# Patient Record
Sex: Female | Born: 1955 | Race: White | Hispanic: No | Marital: Married | Smoking: Never smoker
Health system: Southern US, Community
[De-identification: ages and names within clinical notes are randomized; demographics above are authoritative.]

## PROBLEM LIST (undated history)

## (undated) DIAGNOSIS — Z860100 Personal history of colon polyps, unspecified: Secondary | ICD-10-CM

## (undated) DIAGNOSIS — K219 Gastro-esophageal reflux disease without esophagitis: Secondary | ICD-10-CM

## (undated) DIAGNOSIS — E559 Vitamin D deficiency, unspecified: Secondary | ICD-10-CM

## (undated) DIAGNOSIS — K589 Irritable bowel syndrome without diarrhea: Secondary | ICD-10-CM

## (undated) DIAGNOSIS — E669 Obesity, unspecified: Secondary | ICD-10-CM

## (undated) HISTORY — DX: Obesity, unspecified: E66.9

## (undated) HISTORY — DX: Gastro-esophageal reflux disease without esophagitis: K21.9

## (undated) HISTORY — PX: BREAST EXCISIONAL BIOPSY: SUR124

## (undated) HISTORY — DX: Personal history of colonic polyps: Z86.010

## (undated) HISTORY — DX: Vitamin D deficiency, unspecified: E55.9

## (undated) HISTORY — DX: Irritable bowel syndrome, unspecified: K58.9

## (undated) HISTORY — DX: Personal history of colon polyps, unspecified: Z86.0100

---

## 2005-05-04 ENCOUNTER — Encounter: Admission: RE | Admit: 2005-05-04 | Discharge: 2005-05-04 | Payer: Self-pay | Admitting: Family Medicine

## 2006-08-02 ENCOUNTER — Encounter: Admission: RE | Admit: 2006-08-02 | Discharge: 2006-08-02 | Payer: Self-pay | Admitting: Family Medicine

## 2007-04-11 ENCOUNTER — Emergency Department (HOSPITAL_COMMUNITY): Admission: EM | Admit: 2007-04-11 | Discharge: 2007-04-11 | Payer: Self-pay | Admitting: Emergency Medicine

## 2007-08-03 ENCOUNTER — Encounter: Admission: RE | Admit: 2007-08-03 | Discharge: 2007-08-03 | Payer: Self-pay | Admitting: Family Medicine

## 2008-08-05 ENCOUNTER — Encounter: Admission: RE | Admit: 2008-08-05 | Discharge: 2008-08-05 | Payer: Self-pay | Admitting: Obstetrics and Gynecology

## 2009-08-07 ENCOUNTER — Encounter: Admission: RE | Admit: 2009-08-07 | Discharge: 2009-08-07 | Payer: Self-pay | Admitting: Family Medicine

## 2010-08-25 ENCOUNTER — Other Ambulatory Visit: Payer: Self-pay | Admitting: Family Medicine

## 2010-08-25 DIAGNOSIS — Z1231 Encounter for screening mammogram for malignant neoplasm of breast: Secondary | ICD-10-CM

## 2010-09-02 ENCOUNTER — Ambulatory Visit: Payer: Self-pay

## 2010-09-08 ENCOUNTER — Ambulatory Visit
Admission: RE | Admit: 2010-09-08 | Discharge: 2010-09-08 | Disposition: A | Discharge status: Home / Self Care | Payer: Federal, State, Local not specified - PPO | Source: Ambulatory Visit | Attending: Family Medicine | Admitting: Family Medicine

## 2010-09-08 DIAGNOSIS — Z1231 Encounter for screening mammogram for malignant neoplasm of breast: Secondary | ICD-10-CM

## 2010-11-02 LAB — DIFFERENTIAL
Basophils Relative: 1
Lymphocytes Relative: 29
Monocytes Relative: 5
Neutro Abs: 5.3
Neutrophils Relative %: 64

## 2010-11-02 LAB — BASIC METABOLIC PANEL
CO2: 26
Calcium: 9.1
GFR calc Af Amer: >60
GFR calc non Af Amer: >60
Glucose, Bld: 97
Potassium: 3.6
Sodium: 139

## 2010-11-02 LAB — CBC
Platelets: 257
RDW: 13
WBC: 8.4

## 2010-11-02 LAB — POCT CARDIAC MARKERS
Troponin i, poc: <0.05
Troponin i, poc: <0.05

## 2011-10-29 ENCOUNTER — Other Ambulatory Visit: Payer: Self-pay | Admitting: Family Medicine

## 2011-10-29 DIAGNOSIS — Z1231 Encounter for screening mammogram for malignant neoplasm of breast: Secondary | ICD-10-CM

## 2011-11-25 ENCOUNTER — Ambulatory Visit
Admission: RE | Admit: 2011-11-25 | Discharge: 2011-11-25 | Disposition: A | Discharge status: Home / Self Care | Payer: Federal, State, Local not specified - PPO | Source: Ambulatory Visit | Attending: Family Medicine | Admitting: Family Medicine

## 2011-11-25 DIAGNOSIS — Z1231 Encounter for screening mammogram for malignant neoplasm of breast: Secondary | ICD-10-CM

## 2013-01-15 ENCOUNTER — Other Ambulatory Visit: Payer: Self-pay

## 2013-01-15 DIAGNOSIS — Z1231 Encounter for screening mammogram for malignant neoplasm of breast: Secondary | ICD-10-CM

## 2013-02-12 ENCOUNTER — Ambulatory Visit: Payer: Federal, State, Local not specified - PPO

## 2013-11-05 ENCOUNTER — Encounter (INDEPENDENT_AMBULATORY_CARE_PROVIDER_SITE_OTHER): Payer: Self-pay

## 2013-11-05 ENCOUNTER — Ambulatory Visit
Admission: RE | Admit: 2013-11-05 | Discharge: 2013-11-05 | Disposition: A | Discharge status: Home / Self Care | Payer: Federal, State, Local not specified - PPO | Source: Ambulatory Visit

## 2013-11-05 DIAGNOSIS — Z1231 Encounter for screening mammogram for malignant neoplasm of breast: Secondary | ICD-10-CM

## 2014-09-02 ENCOUNTER — Other Ambulatory Visit: Payer: Self-pay | Admitting: Physician Assistant

## 2014-09-02 DIAGNOSIS — Z803 Family history of malignant neoplasm of breast: Secondary | ICD-10-CM

## 2014-09-02 DIAGNOSIS — N6310 Unspecified lump in the right breast, unspecified quadrant: Secondary | ICD-10-CM

## 2014-09-04 ENCOUNTER — Ambulatory Visit
Admission: RE | Admit: 2014-09-04 | Discharge: 2014-09-04 | Disposition: A | Discharge status: Home / Self Care | Payer: Federal, State, Local not specified - PPO | Source: Ambulatory Visit | Attending: Physician Assistant | Admitting: Physician Assistant

## 2014-09-04 DIAGNOSIS — Z803 Family history of malignant neoplasm of breast: Secondary | ICD-10-CM

## 2014-09-04 DIAGNOSIS — N6310 Unspecified lump in the right breast, unspecified quadrant: Secondary | ICD-10-CM

## 2014-10-15 ENCOUNTER — Other Ambulatory Visit: Payer: Self-pay

## 2014-10-15 DIAGNOSIS — Z1231 Encounter for screening mammogram for malignant neoplasm of breast: Secondary | ICD-10-CM

## 2014-11-11 ENCOUNTER — Ambulatory Visit: Payer: Federal, State, Local not specified - PPO

## 2014-11-15 ENCOUNTER — Ambulatory Visit
Admission: RE | Admit: 2014-11-15 | Discharge: 2014-11-15 | Disposition: A | Discharge status: Home / Self Care | Payer: Federal, State, Local not specified - PPO | Source: Ambulatory Visit

## 2014-11-15 DIAGNOSIS — Z1231 Encounter for screening mammogram for malignant neoplasm of breast: Secondary | ICD-10-CM

## 2015-07-03 DIAGNOSIS — S91202A Unspecified open wound of left great toe with damage to nail, initial encounter: Secondary | ICD-10-CM | POA: Diagnosis not present

## 2015-07-03 DIAGNOSIS — S90212A Contusion of left great toe with damage to nail, initial encounter: Secondary | ICD-10-CM | POA: Diagnosis not present

## 2015-07-16 DIAGNOSIS — L309 Dermatitis, unspecified: Secondary | ICD-10-CM | POA: Diagnosis not present

## 2015-08-05 DIAGNOSIS — L814 Other melanin hyperpigmentation: Secondary | ICD-10-CM | POA: Diagnosis not present

## 2015-08-05 DIAGNOSIS — Z85828 Personal history of other malignant neoplasm of skin: Secondary | ICD-10-CM | POA: Diagnosis not present

## 2015-08-05 DIAGNOSIS — L570 Actinic keratosis: Secondary | ICD-10-CM | POA: Diagnosis not present

## 2015-08-05 DIAGNOSIS — L821 Other seborrheic keratosis: Secondary | ICD-10-CM | POA: Diagnosis not present

## 2015-08-05 DIAGNOSIS — D1800 Hemangioma unspecified site: Secondary | ICD-10-CM | POA: Diagnosis not present

## 2015-10-20 DIAGNOSIS — K080 Exfoliation of teeth due to systemic causes: Secondary | ICD-10-CM | POA: Diagnosis not present

## 2015-12-18 DIAGNOSIS — K080 Exfoliation of teeth due to systemic causes: Secondary | ICD-10-CM | POA: Diagnosis not present

## 2016-01-05 DIAGNOSIS — M25512 Pain in left shoulder: Secondary | ICD-10-CM | POA: Diagnosis not present

## 2016-01-05 DIAGNOSIS — M542 Cervicalgia: Secondary | ICD-10-CM | POA: Diagnosis not present

## 2016-01-05 DIAGNOSIS — M9901 Segmental and somatic dysfunction of cervical region: Secondary | ICD-10-CM | POA: Diagnosis not present

## 2016-01-05 DIAGNOSIS — M5413 Radiculopathy, cervicothoracic region: Secondary | ICD-10-CM | POA: Diagnosis not present

## 2016-01-07 DIAGNOSIS — M5413 Radiculopathy, cervicothoracic region: Secondary | ICD-10-CM | POA: Diagnosis not present

## 2016-01-07 DIAGNOSIS — M25512 Pain in left shoulder: Secondary | ICD-10-CM | POA: Diagnosis not present

## 2016-01-07 DIAGNOSIS — M542 Cervicalgia: Secondary | ICD-10-CM | POA: Diagnosis not present

## 2016-01-07 DIAGNOSIS — M9901 Segmental and somatic dysfunction of cervical region: Secondary | ICD-10-CM | POA: Diagnosis not present

## 2016-01-12 DIAGNOSIS — M542 Cervicalgia: Secondary | ICD-10-CM | POA: Diagnosis not present

## 2016-01-12 DIAGNOSIS — M25512 Pain in left shoulder: Secondary | ICD-10-CM | POA: Diagnosis not present

## 2016-01-12 DIAGNOSIS — M5413 Radiculopathy, cervicothoracic region: Secondary | ICD-10-CM | POA: Diagnosis not present

## 2016-01-12 DIAGNOSIS — M9901 Segmental and somatic dysfunction of cervical region: Secondary | ICD-10-CM | POA: Diagnosis not present

## 2016-01-20 DIAGNOSIS — M5413 Radiculopathy, cervicothoracic region: Secondary | ICD-10-CM | POA: Diagnosis not present

## 2016-01-20 DIAGNOSIS — M9901 Segmental and somatic dysfunction of cervical region: Secondary | ICD-10-CM | POA: Diagnosis not present

## 2016-01-20 DIAGNOSIS — M25512 Pain in left shoulder: Secondary | ICD-10-CM | POA: Diagnosis not present

## 2016-01-20 DIAGNOSIS — M542 Cervicalgia: Secondary | ICD-10-CM | POA: Diagnosis not present

## 2016-02-18 ENCOUNTER — Other Ambulatory Visit: Payer: Self-pay | Admitting: Family Medicine

## 2016-02-18 DIAGNOSIS — Z1231 Encounter for screening mammogram for malignant neoplasm of breast: Secondary | ICD-10-CM

## 2016-02-23 DIAGNOSIS — Z0000 Encounter for general adult medical examination without abnormal findings: Secondary | ICD-10-CM | POA: Diagnosis not present

## 2016-02-23 DIAGNOSIS — Z23 Encounter for immunization: Secondary | ICD-10-CM | POA: Diagnosis not present

## 2016-02-23 DIAGNOSIS — R5383 Other fatigue: Secondary | ICD-10-CM | POA: Diagnosis not present

## 2016-02-27 ENCOUNTER — Ambulatory Visit: Payer: Federal, State, Local not specified - PPO

## 2016-03-16 ENCOUNTER — Ambulatory Visit
Admission: RE | Admit: 2016-03-16 | Discharge: 2016-03-16 | Disposition: A | Discharge status: Home / Self Care | Payer: Federal, State, Local not specified - PPO | Source: Ambulatory Visit | Attending: Family Medicine | Admitting: Family Medicine

## 2016-03-16 DIAGNOSIS — Z1231 Encounter for screening mammogram for malignant neoplasm of breast: Secondary | ICD-10-CM | POA: Diagnosis not present

## 2016-03-18 ENCOUNTER — Other Ambulatory Visit: Payer: Self-pay | Admitting: Family Medicine

## 2016-03-18 DIAGNOSIS — R928 Other abnormal and inconclusive findings on diagnostic imaging of breast: Secondary | ICD-10-CM

## 2016-03-22 ENCOUNTER — Ambulatory Visit
Admission: RE | Admit: 2016-03-22 | Discharge: 2016-03-22 | Disposition: A | Discharge status: Home / Self Care | Payer: Federal, State, Local not specified - PPO | Source: Ambulatory Visit | Attending: Family Medicine | Admitting: Family Medicine

## 2016-03-22 DIAGNOSIS — R922 Inconclusive mammogram: Secondary | ICD-10-CM | POA: Diagnosis not present

## 2016-03-22 DIAGNOSIS — R928 Other abnormal and inconclusive findings on diagnostic imaging of breast: Secondary | ICD-10-CM

## 2016-05-20 DIAGNOSIS — M542 Cervicalgia: Secondary | ICD-10-CM | POA: Diagnosis not present

## 2016-05-20 DIAGNOSIS — M9901 Segmental and somatic dysfunction of cervical region: Secondary | ICD-10-CM | POA: Diagnosis not present

## 2016-05-20 DIAGNOSIS — M9903 Segmental and somatic dysfunction of lumbar region: Secondary | ICD-10-CM | POA: Diagnosis not present

## 2016-05-20 DIAGNOSIS — M545 Low back pain: Secondary | ICD-10-CM | POA: Diagnosis not present

## 2016-05-25 DIAGNOSIS — M9903 Segmental and somatic dysfunction of lumbar region: Secondary | ICD-10-CM | POA: Diagnosis not present

## 2016-05-25 DIAGNOSIS — M545 Low back pain: Secondary | ICD-10-CM | POA: Diagnosis not present

## 2016-05-25 DIAGNOSIS — M542 Cervicalgia: Secondary | ICD-10-CM | POA: Diagnosis not present

## 2016-05-25 DIAGNOSIS — M9901 Segmental and somatic dysfunction of cervical region: Secondary | ICD-10-CM | POA: Diagnosis not present

## 2016-06-03 DIAGNOSIS — L03113 Cellulitis of right upper limb: Secondary | ICD-10-CM | POA: Diagnosis not present

## 2016-06-03 DIAGNOSIS — L309 Dermatitis, unspecified: Secondary | ICD-10-CM | POA: Diagnosis not present

## 2016-07-07 DIAGNOSIS — Z85828 Personal history of other malignant neoplasm of skin: Secondary | ICD-10-CM | POA: Diagnosis not present

## 2016-07-07 DIAGNOSIS — L814 Other melanin hyperpigmentation: Secondary | ICD-10-CM | POA: Diagnosis not present

## 2016-07-07 DIAGNOSIS — D1800 Hemangioma unspecified site: Secondary | ICD-10-CM | POA: Diagnosis not present

## 2016-07-07 DIAGNOSIS — L821 Other seborrheic keratosis: Secondary | ICD-10-CM | POA: Diagnosis not present

## 2016-07-07 DIAGNOSIS — B078 Other viral warts: Secondary | ICD-10-CM | POA: Diagnosis not present

## 2016-12-20 DIAGNOSIS — M9902 Segmental and somatic dysfunction of thoracic region: Secondary | ICD-10-CM | POA: Diagnosis not present

## 2016-12-20 DIAGNOSIS — M9901 Segmental and somatic dysfunction of cervical region: Secondary | ICD-10-CM | POA: Diagnosis not present

## 2016-12-20 DIAGNOSIS — M546 Pain in thoracic spine: Secondary | ICD-10-CM | POA: Diagnosis not present

## 2016-12-20 DIAGNOSIS — M5413 Radiculopathy, cervicothoracic region: Secondary | ICD-10-CM | POA: Diagnosis not present

## 2016-12-22 DIAGNOSIS — M9902 Segmental and somatic dysfunction of thoracic region: Secondary | ICD-10-CM | POA: Diagnosis not present

## 2016-12-22 DIAGNOSIS — M9901 Segmental and somatic dysfunction of cervical region: Secondary | ICD-10-CM | POA: Diagnosis not present

## 2016-12-22 DIAGNOSIS — M546 Pain in thoracic spine: Secondary | ICD-10-CM | POA: Diagnosis not present

## 2016-12-22 DIAGNOSIS — M5413 Radiculopathy, cervicothoracic region: Secondary | ICD-10-CM | POA: Diagnosis not present

## 2017-05-13 DIAGNOSIS — E669 Obesity, unspecified: Secondary | ICD-10-CM | POA: Diagnosis not present

## 2017-05-13 DIAGNOSIS — Z0000 Encounter for general adult medical examination without abnormal findings: Secondary | ICD-10-CM | POA: Diagnosis not present

## 2017-05-13 DIAGNOSIS — E559 Vitamin D deficiency, unspecified: Secondary | ICD-10-CM | POA: Diagnosis not present

## 2017-05-13 DIAGNOSIS — Z1211 Encounter for screening for malignant neoplasm of colon: Secondary | ICD-10-CM | POA: Diagnosis not present

## 2017-05-31 DIAGNOSIS — J209 Acute bronchitis, unspecified: Secondary | ICD-10-CM | POA: Diagnosis not present

## 2017-06-23 DIAGNOSIS — B370 Candidal stomatitis: Secondary | ICD-10-CM | POA: Diagnosis not present

## 2017-06-23 DIAGNOSIS — K130 Diseases of lips: Secondary | ICD-10-CM | POA: Diagnosis not present

## 2017-07-14 DIAGNOSIS — H1031 Unspecified acute conjunctivitis, right eye: Secondary | ICD-10-CM | POA: Diagnosis not present

## 2017-07-14 DIAGNOSIS — J069 Acute upper respiratory infection, unspecified: Secondary | ICD-10-CM | POA: Diagnosis not present

## 2017-07-19 DIAGNOSIS — D1800 Hemangioma unspecified site: Secondary | ICD-10-CM | POA: Diagnosis not present

## 2017-07-19 DIAGNOSIS — Z85828 Personal history of other malignant neoplasm of skin: Secondary | ICD-10-CM | POA: Diagnosis not present

## 2017-07-19 DIAGNOSIS — L570 Actinic keratosis: Secondary | ICD-10-CM | POA: Diagnosis not present

## 2017-07-19 DIAGNOSIS — L821 Other seborrheic keratosis: Secondary | ICD-10-CM | POA: Diagnosis not present

## 2017-07-19 DIAGNOSIS — L814 Other melanin hyperpigmentation: Secondary | ICD-10-CM | POA: Diagnosis not present

## 2017-07-24 DIAGNOSIS — H04551 Acquired stenosis of right nasolacrimal duct: Secondary | ICD-10-CM | POA: Diagnosis not present

## 2017-07-26 DIAGNOSIS — H10011 Acute follicular conjunctivitis, right eye: Secondary | ICD-10-CM | POA: Diagnosis not present

## 2017-08-01 DIAGNOSIS — H10012 Acute follicular conjunctivitis, left eye: Secondary | ICD-10-CM | POA: Diagnosis not present

## 2017-08-01 DIAGNOSIS — H10011 Acute follicular conjunctivitis, right eye: Secondary | ICD-10-CM | POA: Diagnosis not present

## 2017-10-14 DIAGNOSIS — K080 Exfoliation of teeth due to systemic causes: Secondary | ICD-10-CM | POA: Diagnosis not present

## 2017-11-21 DIAGNOSIS — M9902 Segmental and somatic dysfunction of thoracic region: Secondary | ICD-10-CM | POA: Diagnosis not present

## 2017-11-21 DIAGNOSIS — M546 Pain in thoracic spine: Secondary | ICD-10-CM | POA: Diagnosis not present

## 2017-11-21 DIAGNOSIS — M9903 Segmental and somatic dysfunction of lumbar region: Secondary | ICD-10-CM | POA: Diagnosis not present

## 2017-11-21 DIAGNOSIS — M5417 Radiculopathy, lumbosacral region: Secondary | ICD-10-CM | POA: Diagnosis not present

## 2017-11-24 DIAGNOSIS — M9902 Segmental and somatic dysfunction of thoracic region: Secondary | ICD-10-CM | POA: Diagnosis not present

## 2017-11-24 DIAGNOSIS — M9903 Segmental and somatic dysfunction of lumbar region: Secondary | ICD-10-CM | POA: Diagnosis not present

## 2017-11-24 DIAGNOSIS — M5417 Radiculopathy, lumbosacral region: Secondary | ICD-10-CM | POA: Diagnosis not present

## 2017-11-24 DIAGNOSIS — M546 Pain in thoracic spine: Secondary | ICD-10-CM | POA: Diagnosis not present

## 2017-11-29 DIAGNOSIS — M546 Pain in thoracic spine: Secondary | ICD-10-CM | POA: Diagnosis not present

## 2017-11-29 DIAGNOSIS — M9902 Segmental and somatic dysfunction of thoracic region: Secondary | ICD-10-CM | POA: Diagnosis not present

## 2017-11-29 DIAGNOSIS — M9903 Segmental and somatic dysfunction of lumbar region: Secondary | ICD-10-CM | POA: Diagnosis not present

## 2017-11-29 DIAGNOSIS — M5417 Radiculopathy, lumbosacral region: Secondary | ICD-10-CM | POA: Diagnosis not present

## 2017-12-02 DIAGNOSIS — M546 Pain in thoracic spine: Secondary | ICD-10-CM | POA: Diagnosis not present

## 2017-12-02 DIAGNOSIS — M9902 Segmental and somatic dysfunction of thoracic region: Secondary | ICD-10-CM | POA: Diagnosis not present

## 2017-12-02 DIAGNOSIS — M5417 Radiculopathy, lumbosacral region: Secondary | ICD-10-CM | POA: Diagnosis not present

## 2017-12-02 DIAGNOSIS — M9903 Segmental and somatic dysfunction of lumbar region: Secondary | ICD-10-CM | POA: Diagnosis not present

## 2017-12-12 ENCOUNTER — Other Ambulatory Visit: Payer: Self-pay | Admitting: Family Medicine

## 2017-12-12 DIAGNOSIS — Z1231 Encounter for screening mammogram for malignant neoplasm of breast: Secondary | ICD-10-CM

## 2018-01-23 ENCOUNTER — Ambulatory Visit
Admission: RE | Admit: 2018-01-23 | Discharge: 2018-01-23 | Disposition: A | Discharge status: Home / Self Care | Payer: Federal, State, Local not specified - PPO | Source: Ambulatory Visit | Attending: Family Medicine | Admitting: Family Medicine

## 2018-01-23 DIAGNOSIS — Z1231 Encounter for screening mammogram for malignant neoplasm of breast: Secondary | ICD-10-CM | POA: Diagnosis not present

## 2018-02-06 DIAGNOSIS — M546 Pain in thoracic spine: Secondary | ICD-10-CM | POA: Diagnosis not present

## 2018-02-06 DIAGNOSIS — M5417 Radiculopathy, lumbosacral region: Secondary | ICD-10-CM | POA: Diagnosis not present

## 2018-02-06 DIAGNOSIS — M9903 Segmental and somatic dysfunction of lumbar region: Secondary | ICD-10-CM | POA: Diagnosis not present

## 2018-02-06 DIAGNOSIS — M9902 Segmental and somatic dysfunction of thoracic region: Secondary | ICD-10-CM | POA: Diagnosis not present

## 2018-05-23 DIAGNOSIS — Z13220 Encounter for screening for lipoid disorders: Secondary | ICD-10-CM | POA: Diagnosis not present

## 2018-05-23 DIAGNOSIS — Z0000 Encounter for general adult medical examination without abnormal findings: Secondary | ICD-10-CM | POA: Diagnosis not present

## 2018-05-23 DIAGNOSIS — E559 Vitamin D deficiency, unspecified: Secondary | ICD-10-CM | POA: Diagnosis not present

## 2018-07-31 DIAGNOSIS — L821 Other seborrheic keratosis: Secondary | ICD-10-CM | POA: Diagnosis not present

## 2018-07-31 DIAGNOSIS — L814 Other melanin hyperpigmentation: Secondary | ICD-10-CM | POA: Diagnosis not present

## 2018-07-31 DIAGNOSIS — Z85828 Personal history of other malignant neoplasm of skin: Secondary | ICD-10-CM | POA: Diagnosis not present

## 2018-07-31 DIAGNOSIS — D225 Melanocytic nevi of trunk: Secondary | ICD-10-CM | POA: Diagnosis not present

## 2018-07-31 DIAGNOSIS — L820 Inflamed seborrheic keratosis: Secondary | ICD-10-CM | POA: Diagnosis not present

## 2018-07-31 DIAGNOSIS — L570 Actinic keratosis: Secondary | ICD-10-CM | POA: Diagnosis not present

## 2018-08-07 DIAGNOSIS — M9903 Segmental and somatic dysfunction of lumbar region: Secondary | ICD-10-CM | POA: Diagnosis not present

## 2018-08-07 DIAGNOSIS — M9902 Segmental and somatic dysfunction of thoracic region: Secondary | ICD-10-CM | POA: Diagnosis not present

## 2018-08-07 DIAGNOSIS — M546 Pain in thoracic spine: Secondary | ICD-10-CM | POA: Diagnosis not present

## 2018-08-07 DIAGNOSIS — M5417 Radiculopathy, lumbosacral region: Secondary | ICD-10-CM | POA: Diagnosis not present

## 2018-09-25 DIAGNOSIS — M9904 Segmental and somatic dysfunction of sacral region: Secondary | ICD-10-CM | POA: Diagnosis not present

## 2018-09-25 DIAGNOSIS — M542 Cervicalgia: Secondary | ICD-10-CM | POA: Diagnosis not present

## 2018-09-25 DIAGNOSIS — M5412 Radiculopathy, cervical region: Secondary | ICD-10-CM | POA: Diagnosis not present

## 2018-09-25 DIAGNOSIS — M62830 Muscle spasm of back: Secondary | ICD-10-CM | POA: Diagnosis not present

## 2018-09-30 DIAGNOSIS — M542 Cervicalgia: Secondary | ICD-10-CM | POA: Diagnosis not present

## 2018-09-30 DIAGNOSIS — M5412 Radiculopathy, cervical region: Secondary | ICD-10-CM | POA: Diagnosis not present

## 2018-09-30 DIAGNOSIS — M9904 Segmental and somatic dysfunction of sacral region: Secondary | ICD-10-CM | POA: Diagnosis not present

## 2018-09-30 DIAGNOSIS — M62830 Muscle spasm of back: Secondary | ICD-10-CM | POA: Diagnosis not present

## 2018-10-02 DIAGNOSIS — L820 Inflamed seborrheic keratosis: Secondary | ICD-10-CM | POA: Diagnosis not present

## 2018-10-03 DIAGNOSIS — M542 Cervicalgia: Secondary | ICD-10-CM | POA: Diagnosis not present

## 2018-10-03 DIAGNOSIS — M5412 Radiculopathy, cervical region: Secondary | ICD-10-CM | POA: Diagnosis not present

## 2018-10-03 DIAGNOSIS — M62830 Muscle spasm of back: Secondary | ICD-10-CM | POA: Diagnosis not present

## 2018-10-03 DIAGNOSIS — M9904 Segmental and somatic dysfunction of sacral region: Secondary | ICD-10-CM | POA: Diagnosis not present

## 2018-10-05 DIAGNOSIS — M62830 Muscle spasm of back: Secondary | ICD-10-CM | POA: Diagnosis not present

## 2018-10-05 DIAGNOSIS — M9904 Segmental and somatic dysfunction of sacral region: Secondary | ICD-10-CM | POA: Diagnosis not present

## 2018-10-05 DIAGNOSIS — M5412 Radiculopathy, cervical region: Secondary | ICD-10-CM | POA: Diagnosis not present

## 2018-10-05 DIAGNOSIS — M542 Cervicalgia: Secondary | ICD-10-CM | POA: Diagnosis not present

## 2018-10-07 DIAGNOSIS — M62830 Muscle spasm of back: Secondary | ICD-10-CM | POA: Diagnosis not present

## 2018-10-07 DIAGNOSIS — M5412 Radiculopathy, cervical region: Secondary | ICD-10-CM | POA: Diagnosis not present

## 2018-10-07 DIAGNOSIS — M9904 Segmental and somatic dysfunction of sacral region: Secondary | ICD-10-CM | POA: Diagnosis not present

## 2018-10-07 DIAGNOSIS — M542 Cervicalgia: Secondary | ICD-10-CM | POA: Diagnosis not present

## 2018-10-09 DIAGNOSIS — M62830 Muscle spasm of back: Secondary | ICD-10-CM | POA: Diagnosis not present

## 2018-10-09 DIAGNOSIS — M9904 Segmental and somatic dysfunction of sacral region: Secondary | ICD-10-CM | POA: Diagnosis not present

## 2018-10-09 DIAGNOSIS — M542 Cervicalgia: Secondary | ICD-10-CM | POA: Diagnosis not present

## 2018-10-09 DIAGNOSIS — M5412 Radiculopathy, cervical region: Secondary | ICD-10-CM | POA: Diagnosis not present

## 2018-10-11 DIAGNOSIS — M62830 Muscle spasm of back: Secondary | ICD-10-CM | POA: Diagnosis not present

## 2018-10-11 DIAGNOSIS — M5412 Radiculopathy, cervical region: Secondary | ICD-10-CM | POA: Diagnosis not present

## 2018-10-11 DIAGNOSIS — M542 Cervicalgia: Secondary | ICD-10-CM | POA: Diagnosis not present

## 2018-10-11 DIAGNOSIS — M9904 Segmental and somatic dysfunction of sacral region: Secondary | ICD-10-CM | POA: Diagnosis not present

## 2018-10-13 DIAGNOSIS — M542 Cervicalgia: Secondary | ICD-10-CM | POA: Diagnosis not present

## 2018-10-13 DIAGNOSIS — M62830 Muscle spasm of back: Secondary | ICD-10-CM | POA: Diagnosis not present

## 2018-10-13 DIAGNOSIS — M5412 Radiculopathy, cervical region: Secondary | ICD-10-CM | POA: Diagnosis not present

## 2018-10-13 DIAGNOSIS — M9904 Segmental and somatic dysfunction of sacral region: Secondary | ICD-10-CM | POA: Diagnosis not present

## 2018-10-17 DIAGNOSIS — M62830 Muscle spasm of back: Secondary | ICD-10-CM | POA: Diagnosis not present

## 2018-10-17 DIAGNOSIS — M5412 Radiculopathy, cervical region: Secondary | ICD-10-CM | POA: Diagnosis not present

## 2018-10-17 DIAGNOSIS — M542 Cervicalgia: Secondary | ICD-10-CM | POA: Diagnosis not present

## 2018-10-17 DIAGNOSIS — M9904 Segmental and somatic dysfunction of sacral region: Secondary | ICD-10-CM | POA: Diagnosis not present

## 2018-10-23 DIAGNOSIS — M9904 Segmental and somatic dysfunction of sacral region: Secondary | ICD-10-CM | POA: Diagnosis not present

## 2018-10-23 DIAGNOSIS — M62830 Muscle spasm of back: Secondary | ICD-10-CM | POA: Diagnosis not present

## 2018-10-23 DIAGNOSIS — M542 Cervicalgia: Secondary | ICD-10-CM | POA: Diagnosis not present

## 2018-10-23 DIAGNOSIS — M5412 Radiculopathy, cervical region: Secondary | ICD-10-CM | POA: Diagnosis not present

## 2018-10-26 DIAGNOSIS — M62830 Muscle spasm of back: Secondary | ICD-10-CM | POA: Diagnosis not present

## 2018-10-26 DIAGNOSIS — M5412 Radiculopathy, cervical region: Secondary | ICD-10-CM | POA: Diagnosis not present

## 2018-10-26 DIAGNOSIS — M9904 Segmental and somatic dysfunction of sacral region: Secondary | ICD-10-CM | POA: Diagnosis not present

## 2018-10-26 DIAGNOSIS — M542 Cervicalgia: Secondary | ICD-10-CM | POA: Diagnosis not present

## 2018-11-03 DIAGNOSIS — L237 Allergic contact dermatitis due to plants, except food: Secondary | ICD-10-CM | POA: Diagnosis not present

## 2018-11-06 DIAGNOSIS — L820 Inflamed seborrheic keratosis: Secondary | ICD-10-CM | POA: Diagnosis not present

## 2018-11-06 DIAGNOSIS — L309 Dermatitis, unspecified: Secondary | ICD-10-CM | POA: Diagnosis not present

## 2019-03-26 DIAGNOSIS — J989 Respiratory disorder, unspecified: Secondary | ICD-10-CM | POA: Diagnosis not present

## 2019-05-25 ENCOUNTER — Other Ambulatory Visit: Payer: Self-pay | Admitting: Family Medicine

## 2019-05-25 DIAGNOSIS — E669 Obesity, unspecified: Secondary | ICD-10-CM | POA: Diagnosis not present

## 2019-05-25 DIAGNOSIS — Z0000 Encounter for general adult medical examination without abnormal findings: Secondary | ICD-10-CM | POA: Diagnosis not present

## 2019-05-25 DIAGNOSIS — Z13220 Encounter for screening for lipoid disorders: Secondary | ICD-10-CM | POA: Diagnosis not present

## 2019-05-25 DIAGNOSIS — K219 Gastro-esophageal reflux disease without esophagitis: Secondary | ICD-10-CM | POA: Diagnosis not present

## 2019-05-25 DIAGNOSIS — R002 Palpitations: Secondary | ICD-10-CM | POA: Diagnosis not present

## 2019-05-25 DIAGNOSIS — E559 Vitamin D deficiency, unspecified: Secondary | ICD-10-CM | POA: Diagnosis not present

## 2019-05-25 DIAGNOSIS — Z1231 Encounter for screening mammogram for malignant neoplasm of breast: Secondary | ICD-10-CM

## 2019-05-28 ENCOUNTER — Telehealth: Payer: Self-pay

## 2019-05-28 NOTE — Telephone Encounter (Signed)
EKG ON FILE RJ 

## 2019-05-31 ENCOUNTER — Other Ambulatory Visit: Payer: Self-pay

## 2019-05-31 ENCOUNTER — Ambulatory Visit
Admission: RE | Admit: 2019-05-31 | Discharge: 2019-05-31 | Disposition: A | Discharge status: Home / Self Care | Payer: Federal, State, Local not specified - PPO | Source: Ambulatory Visit | Attending: Family Medicine | Admitting: Family Medicine

## 2019-05-31 DIAGNOSIS — Z1231 Encounter for screening mammogram for malignant neoplasm of breast: Secondary | ICD-10-CM | POA: Diagnosis not present

## 2019-06-01 ENCOUNTER — Other Ambulatory Visit: Payer: Self-pay | Admitting: Family Medicine

## 2019-06-01 DIAGNOSIS — R928 Other abnormal and inconclusive findings on diagnostic imaging of breast: Secondary | ICD-10-CM

## 2019-06-02 ENCOUNTER — Encounter: Payer: Self-pay | Admitting: Cardiology

## 2019-06-05 ENCOUNTER — Ambulatory Visit: Payer: Federal, State, Local not specified - PPO | Admitting: Cardiology

## 2019-06-05 ENCOUNTER — Other Ambulatory Visit: Payer: Self-pay

## 2019-06-06 ENCOUNTER — Ambulatory Visit: Payer: Federal, State, Local not specified - PPO | Admitting: Cardiology

## 2019-06-06 ENCOUNTER — Encounter: Payer: Self-pay | Admitting: Cardiology

## 2019-06-06 ENCOUNTER — Other Ambulatory Visit: Payer: Self-pay

## 2019-06-06 VITALS — BP 134/80 | HR 83 | Ht 68.0 in | Wt 262.0 lb

## 2019-06-06 DIAGNOSIS — R600 Localized edema: Secondary | ICD-10-CM | POA: Insufficient documentation

## 2019-06-06 DIAGNOSIS — R002 Palpitations: Secondary | ICD-10-CM | POA: Diagnosis not present

## 2019-06-06 DIAGNOSIS — Z8616 Personal history of COVID-19: Secondary | ICD-10-CM | POA: Diagnosis not present

## 2019-06-06 DIAGNOSIS — E669 Obesity, unspecified: Secondary | ICD-10-CM | POA: Diagnosis not present

## 2019-06-06 MED ORDER — FUROSEMIDE 40 MG PO TABS: 40.0000 mg | ORAL_TABLET | Freq: Every day | ORAL | 1 refills | Status: DC

## 2019-06-06 MED ORDER — POTASSIUM CHLORIDE CRYS ER 20 MEQ PO TBCR: 20.0000 meq | EXTENDED_RELEASE_TABLET | Freq: Every day | ORAL | 1 refills | Status: DC

## 2019-06-06 NOTE — Progress Notes (Signed)
Cardiology Office Note:    Date:  06/06/2019   ID:  Rachel Carey, DOB 1955-07-23, MRN [id number]  PCP:  Sigmund HazelMiller, Lisa, MD  Cardiologist:  Thomasene RippleKardie Arryanna Holquin, DO  Electrophysiologist:  None   Referring MD: Jarrett SohoWharton, Courtney, PA-C   Chief Complaint  Patient presents with  . New Patient (Initial Visit)   History of Present Illness:    Rachel Carey is a 64 y.o. female with a hx of obesity, GERD, bilateral leg edema and history of COVID-19 infection was referred by her primary care physician for PVCs and leg edema.  The patient reports that recently she saw her primary care physician who told her that her EKG showed evidence of PVCs.  She notes that a few days leading to that she had been drinking significant coffee and she felt palpitations.  Since that visit she has not felt any palpitations.  She denies any shortness of breath, chest pain, nausea or vomiting.  Reported prior palpitations but stated these have resolved.  Past Medical History:  Diagnosis Date  . GERD (gastroesophageal reflux disease)   . History of colon polyps   . IBS (irritable bowel syndrome)   . Obesity (BMI 30-39.9)   . Vitamin D deficiency     Past Surgical History:  Procedure Laterality Date  . BREAST EXCISIONAL BIOPSY Right    started as mole removal and then incision opened    Current Medications: Current Meds  Medication Sig  . [DISCONTINUED] chlorthalidone (HYGROTON) 25 MG tablet Take 25 mg by mouth every morning.     Allergies:   Amoxicillin   Social History   Socioeconomic History  . Marital status: Married    Spouse name: Not on file  . Number of children: Not on file  . Years of education: Not on file  . Highest education level: Not on file  Occupational History  . Not on file  Tobacco Use  . Smoking status: Never Smoker  . Smokeless tobacco: Never Used  Substance and Sexual Activity  . Alcohol use: Yes  . Drug use: Never  . Sexual activity: Not on file  Other Topics Concern  .  Not on file  Social History Narrative  . Not on file   Social Determinants of Health   Financial Resource Strain:   . Difficulty of Paying Living Expenses:   Food Insecurity:   . Worried About Programme researcher, broadcasting/film/videounning Out of Food in the Last Year:   . Baristaan Out of Food in the Last Year:   Transportation Needs:   . Freight forwarderLack of Transportation (Medical):   Marland Kitchen. Lack of Transportation (Non-Medical):   Physical Activity:   . Days of Exercise per Week:   . Minutes of Exercise per Session:   Stress:   . Feeling of Stress :   Social Connections:   . Frequency of Communication with Friends and Family:   . Frequency of Social Gatherings with Friends and Family:   . Attends Religious Services:   . Active Member of Clubs or Organizations:   . Attends BankerClub or Organization Meetings:   Marland Kitchen. Marital Status:      Family History: The patient's family history includes Breast cancer (age of onset: 2063) in her mother.  ROS:   Review of Systems  Constitution: Negative for decreased appetite, fever and weight gain.  HENT: Negative for congestion, ear discharge, hoarse voice and sore throat.   Eyes: Negative for discharge, redness, vision loss in right eye and visual halos.  Cardiovascular: Negative for chest  pain, dyspnea on exertion, leg swelling, orthopnea and palpitations.  Respiratory: Negative for cough, hemoptysis, shortness of breath and snoring.   Endocrine: Negative for heat intolerance and polyphagia.  Hematologic/Lymphatic: Negative for bleeding problem. Does not bruise/bleed easily.  Skin: Negative for flushing, nail changes, rash and suspicious lesions.  Musculoskeletal: Negative for arthritis, joint pain, muscle cramps, myalgias, neck pain and stiffness.  Gastrointestinal: Negative for abdominal pain, bowel incontinence, diarrhea and excessive appetite.  Genitourinary: Negative for decreased libido, genital sores and incomplete emptying.  Neurological: Negative for brief paralysis, focal weakness, headaches and  loss of balance.  Psychiatric/Behavioral: Negative for altered mental status, depression and suicidal ideas.  Allergic/Immunologic: Negative for HIV exposure and persistent infections.    EKGs/Labs/Other Studies Reviewed:    The following studies were reviewed today:   EKG:  The ekg ordered today demonstrates NSR, HR 83 bpm with now prior ekg for comparison.   Recent Labs: No results found for requested labs within last 8760 hours.  Recent Lipid Panel No results found for: CHOL, TRIG, HDL, CHOLHDL, VLDL, LDLCALC, LDLDIRECT  Physical Exam:    VS:  BP 134/80   Pulse 83   Ht 5\' 8"  (1.727 m)   Wt 262 lb (118.8 kg)   SpO2 96%   BMI 39.84 kg/m     Wt Readings from Last 3 Encounters:  06/06/19 262 lb (118.8 kg)     GEN: Well nourished, well developed in no acute distress HEENT: Normal NECK: No JVD; No carotid bruits LYMPHATICS: No lymphadenopathy CARDIAC: S1S2 noted,RRR, no murmurs, rubs, gallops RESPIRATORY:  Clear to auscultation without rales, wheezing or rhonchi  ABDOMEN: Soft, non-tender, non-distended, +bowel sounds, no guarding. EXTREMITIES:+1 bilateral edema, No cyanosis, no clubbing MUSCULOSKELETAL:  No deformity  SKIN: Warm and dry NEUROLOGIC:  Alert and oriented x 3, non-focal PSYCHIATRIC:  Normal affect, good insight  ASSESSMENT:    1. Bilateral leg edema   2. Palpitations   3. History of COVID-19   4. Obesity (BMI 30-39.9)    PLAN:    With her bilateral leg edema, I discuss with her that this could be due venous insufficiency versus cardiovascular source. She has not been taking chlorthalidone which she tells me was started due to her bilateral leg edema.  I am going to start the patient on Lasix 40 mg Tuesday Thursday and Saturdays with potassium supplements.  I have educated her that I am hoping that this will help decrease her leg edema.  And asked her to also weigh herself daily to see if we could see changes from this.  In addition I have advised her to  use support stockings and also decrease her salt intake.  I will also order echocardiogram to rule out any type of cardiomyopathies or any valvular abnormalities.  History of COVID-19 infection-patient has recovered well, and echocardiogram also help her make sure that there was no sequela from COVID-19 to her heart.  Palpitations do seem to have resolved.  For now we will hold off on placing a monitor and the patient.  However we will assess the need for an ambulatory monitor if symptoms start to recur.  Obesity-the patient understands the need to lose weight with diet and exercise. We have discussed specific strategies for this.  The patient is in agreement with the above plan. The patient left the office in stable condition.  The patient will follow up in 3 months or sooner if needed.  Medication Adjustments/Labs and Tests Ordered: Current medicines are reviewed at length  with the patient today.  Concerns regarding medicines are outlined above.  Orders Placed This Encounter  Procedures  . EKG 12-Lead  . ECHOCARDIOGRAM COMPLETE   Meds ordered this encounter  Medications  . furosemide (LASIX) 40 MG tablet    Sig: Take 1 tablet (40 mg total) by mouth daily.    Dispense:  90 tablet    Refill:  1  . potassium chloride SA (KLOR-CON) 20 MEQ tablet    Sig: Take 1 tablet (20 mEq total) by mouth daily.    Dispense:  90 tablet    Refill:  1    Patient Instructions  Medication Instructions:  Your physician has recommended you make the following change in your medication:   STOP: Chlorthalidone   START: Lasix 40 mg daily   START: Potassium 20 meq daily  *If you need a refill on your cardiac medications before your next appointment, please call your pharmacy*   Lab Work: None.  If you have labs (blood work) drawn today and your tests are completely normal, you will receive your results only by: Marland Kitchen MyChart Message (if you have MyChart) OR . A paper copy in the mail If you have any  lab test that is abnormal or we need to change your treatment, we will call you to review the results.   Testing/Procedures: Your physician has requested that you have an echocardiogram. Echocardiography is a painless test that uses sound waves to create images of your heart. It provides your doctor with information about the size and shape of your heart and how well your heart's chambers and valves are working. This procedure takes approximately one hour. There are no restrictions for this procedure.     Follow-Up: At [facility], you and your health needs are our priority.  As part of our continuing mission to provide you with exceptional heart care, we have created designated Provider Care Teams.  These Care Teams include your primary Cardiologist (physician) and Advanced Practice Providers (APPs -  Physician Assistants and Nurse Practitioners) who all work together to provide you with the care you need, when you need it.  We recommend signing up for the patient portal called "MyChart".  Sign up information is provided on this After Visit Summary.  MyChart is used to connect with patients for Virtual Visits (Telemedicine).  Patients are able to view lab/test results, encounter notes, upcoming appointments, etc.  Non-urgent messages can be sent to your provider as well.   To learn more about what you can do with MyChart, go to [email or web address].    Your next appointment:   3 month(s)  The format for your next appointment:   In Person  Provider:   Thomasene Ripple, DO   Other Instructions   Echocardiogram An echocardiogram is a procedure that uses painless sound waves (ultrasound) to produce an image of the heart. Images from an echocardiogram can provide important information about:  Signs of coronary artery disease (CAD).  Aneurysm detection. An aneurysm is a weak or damaged part of an artery wall that bulges out from the normal force of blood pumping through the  body.  Heart size and shape. Changes in the size or shape of the heart can be associated with certain conditions, including heart failure, aneurysm, and CAD.  Heart muscle function.  Heart valve function.  Signs of a past heart attack.  Fluid buildup around the heart.  Thickening of the heart muscle.  A tumor or infectious growth around the heart valves. Tell a  health care provider about:  Any allergies you have.  All medicines you are taking, including vitamins, herbs, eye drops, creams, and over-the-counter medicines.  Any blood disorders you have.  Any surgeries you have had.  Any medical conditions you have.  Whether you are pregnant or may be pregnant. What are the risks? Generally, this is a safe procedure. However, problems may occur, including:  Allergic reaction to dye (contrast) that may be used during the procedure. What happens before the procedure? No specific preparation is needed. You may eat and drink normally. What happens during the procedure?   An IV tube may be inserted into one of your veins.  You may receive contrast through this tube. A contrast is an injection that improves the quality of the pictures from your heart.  A gel will be applied to your chest.  A wand-like tool (transducer) will be moved over your chest. The gel will help to transmit the sound waves from the transducer.  The sound waves will harmlessly bounce off of your heart to allow the heart images to be captured in real-time motion. The images will be recorded on a computer. The procedure may vary among health care providers and hospitals. What happens after the procedure?  You may return to your normal, everyday life, including diet, activities, and medicines, unless your health care provider tells you not to do that. Summary  An echocardiogram is a procedure that uses painless sound waves (ultrasound) to produce an image of the heart.  Images from an echocardiogram can  provide important information about the size and shape of your heart, heart muscle function, heart valve function, and fluid buildup around your heart.  You do not need to do anything to prepare before this procedure. You may eat and drink normally.  After the echocardiogram is completed, you may return to your normal, everyday life, unless your health care provider tells you not to do that. This information is not intended to replace advice given to you by your health care provider. Make sure you discuss any questions you have with your health care provider. Document Revised: 05/18/2018 Document Reviewed: 02/28/2016 Elsevier Patient Education  2020 [employer].      Adopting a Healthy Lifestyle.  Know what a healthy weight is for you (roughly BMI <25) and aim to maintain this   Aim for 7+ servings of fruits and vegetables daily   65-80+ fluid ounces of water or unsweet tea for healthy kidneys   Limit to max 1 drink of alcohol per day; avoid smoking/tobacco   Limit animal fats in diet for cholesterol and heart health - choose grass fed whenever available   Avoid highly processed foods, and foods high in saturated/trans fats   Aim for low stress - take time to unwind and care for your mental health   Aim for 150 min of moderate intensity exercise weekly for heart health, and weights twice weekly for bone health   Aim for 7-9 hours of sleep daily   When it comes to diets, agreement about the perfect plan isnt easy to find, even among the experts. Experts at the [facility] [employer] developed an idea known as the Healthy Eating Plate. Just imagine a plate divided into logical, healthy portions.   The emphasis is on diet quality:   Load up on vegetables and fruits - one-half of your plate: Aim for color and variety, and remember that potatoes dont count.   Go for whole grains - one-quarter  of your plate: Whole wheat, barley, wheat berries, quinoa, oats, brown rice,  and foods made with them. If you want pasta, go with whole wheat pasta.   Protein power - one-quarter of your plate: Fish, chicken, beans, and nuts are all healthy, versatile protein sources. Limit red meat.   The diet, however, does go beyond the plate, offering a few other suggestions.   Use healthy plant oils, such as olive, canola, soy, corn, sunflower and peanut. Check the labels, and avoid partially hydrogenated oil, which have unhealthy trans fats.   If youre thirsty, drink water. Coffee and tea are good in moderation, but skip sugary drinks and limit milk and dairy products to one or two daily servings.   The type of carbohydrate in the diet is more important than the amount. Some sources of carbohydrates, such as vegetables, fruits, whole grains, and beans-are healthier than others.   Finally, stay active  Signed, Berniece Salines, DO  06/06/2019 8:26 PM    [facility]

## 2019-06-06 NOTE — Patient Instructions (Signed)
Medication Instructions:  Your physician has recommended you make the following change in your medication:   STOP: Chlorthalidone   START: Lasix 40 mg daily   START: Potassium 20 meq daily  *If you need a refill on your cardiac medications before your next appointment, please call your pharmacy*   Lab Work: None.  If you have labs (blood work) drawn today and your tests are completely normal, you will receive your results only by: Marland Kitchen MyChart Message (if you have MyChart) OR . A paper copy in the mail If you have any lab test that is abnormal or we need to change your treatment, we will call you to review the results.   Testing/Procedures: Your physician has requested that you have an echocardiogram. Echocardiography is a painless test that uses sound waves to create images of your heart. It provides your doctor with information about the size and shape of your heart and how well your heart's chambers and valves are working. This procedure takes approximately one hour. There are no restrictions for this procedure.     Follow-Up: At [facility], you and your health needs are our priority.  As part of our continuing mission to provide you with exceptional heart care, we have created designated Provider Care Teams.  These Care Teams include your primary Cardiologist (physician) and Advanced Practice Providers (APPs -  Physician Assistants and Nurse Practitioners) who all work together to provide you with the care you need, when you need it.  We recommend signing up for the patient portal called "MyChart".  Sign up information is provided on this After Visit Summary.  MyChart is used to connect with patients for Virtual Visits (Telemedicine).  Patients are able to view lab/test results, encounter notes, upcoming appointments, etc.  Non-urgent messages can be sent to your provider as well.   To learn more about what you can do with MyChart, go to [email or web address].    Your next  appointment:   3 month(s)  The format for your next appointment:   In Person  Provider:   Thomasene Ripple, DO   Other Instructions   Echocardiogram An echocardiogram is a procedure that uses painless sound waves (ultrasound) to produce an image of the heart. Images from an echocardiogram can provide important information about:  Signs of coronary artery disease (CAD).  Aneurysm detection. An aneurysm is a weak or damaged part of an artery wall that bulges out from the normal force of blood pumping through the body.  Heart size and shape. Changes in the size or shape of the heart can be associated with certain conditions, including heart failure, aneurysm, and CAD.  Heart muscle function.  Heart valve function.  Signs of a past heart attack.  Fluid buildup around the heart.  Thickening of the heart muscle.  A tumor or infectious growth around the heart valves. Tell a health care provider about:  Any allergies you have.  All medicines you are taking, including vitamins, herbs, eye drops, creams, and over-the-counter medicines.  Any blood disorders you have.  Any surgeries you have had.  Any medical conditions you have.  Whether you are pregnant or may be pregnant. What are the risks? Generally, this is a safe procedure. However, problems may occur, including:  Allergic reaction to dye (contrast) that may be used during the procedure. What happens before the procedure? No specific preparation is needed. You may eat and drink normally. What happens during the procedure?   An IV tube may be inserted into  one of your veins.  You may receive contrast through this tube. A contrast is an injection that improves the quality of the pictures from your heart.  A gel will be applied to your chest.  A wand-like tool (transducer) will be moved over your chest. The gel will help to transmit the sound waves from the transducer.  The sound waves will harmlessly bounce off of  your heart to allow the heart images to be captured in real-time motion. The images will be recorded on a computer. The procedure may vary among health care providers and hospitals. What happens after the procedure?  You may return to your normal, everyday life, including diet, activities, and medicines, unless your health care provider tells you not to do that. Summary  An echocardiogram is a procedure that uses painless sound waves (ultrasound) to produce an image of the heart.  Images from an echocardiogram can provide important information about the size and shape of your heart, heart muscle function, heart valve function, and fluid buildup around your heart.  You do not need to do anything to prepare before this procedure. You may eat and drink normally.  After the echocardiogram is completed, you may return to your normal, everyday life, unless your health care provider tells you not to do that. This information is not intended to replace advice given to you by your health care provider. Make sure you discuss any questions you have with your health care provider. Document Revised: 05/18/2018 Document Reviewed: 02/28/2016 Elsevier Patient Education  [place].

## 2019-06-13 ENCOUNTER — Other Ambulatory Visit: Payer: Self-pay

## 2019-06-13 ENCOUNTER — Ambulatory Visit
Admission: RE | Admit: 2019-06-13 | Discharge: 2019-06-13 | Disposition: A | Discharge status: Home / Self Care | Payer: Federal, State, Local not specified - PPO | Source: Ambulatory Visit | Attending: Family Medicine | Admitting: Family Medicine

## 2019-06-13 ENCOUNTER — Other Ambulatory Visit: Payer: Self-pay | Admitting: Family Medicine

## 2019-06-13 DIAGNOSIS — R928 Other abnormal and inconclusive findings on diagnostic imaging of breast: Secondary | ICD-10-CM

## 2019-06-13 DIAGNOSIS — N6489 Other specified disorders of breast: Secondary | ICD-10-CM | POA: Diagnosis not present

## 2019-06-13 DIAGNOSIS — N6320 Unspecified lump in the left breast, unspecified quadrant: Secondary | ICD-10-CM

## 2019-06-15 ENCOUNTER — Ambulatory Visit (HOSPITAL_BASED_OUTPATIENT_CLINIC_OR_DEPARTMENT_OTHER)
Admission: RE | Admit: 2019-06-15 | Discharge: 2019-06-15 | Disposition: A | Discharge status: Home / Self Care | Payer: Federal, State, Local not specified - PPO | Source: Ambulatory Visit | Attending: Cardiology | Admitting: Cardiology

## 2019-06-15 DIAGNOSIS — R600 Localized edema: Secondary | ICD-10-CM | POA: Diagnosis not present

## 2019-06-15 NOTE — Progress Notes (Signed)
  Echocardiogram 2D Echocardiogram has been performed.  Rachel Carey 06/15/2019, 8:43 AM

## 2019-06-18 ENCOUNTER — Telehealth: Payer: Self-pay

## 2019-06-18 ENCOUNTER — Telehealth: Payer: Self-pay | Admitting: Cardiology

## 2019-06-18 NOTE — Telephone Encounter (Signed)
New message   Patient is returning call for echo results. Please call. 

## 2019-06-18 NOTE — Telephone Encounter (Signed)
-----   Message from Kardie Tobb, DO sent at 06/16/2019  2:24 PM EDT ----- The echo showed that the heart is not fully relaxing like it should ( diastolic dysfunction) ,but otherwise normal. I will discuss it at the next office visit. 

## 2019-06-18 NOTE — Telephone Encounter (Signed)
Spoke with patient regarding results and recommendation.  Patient verbalizes understanding and is agreeable to plan of care. Advised patient to call back with any issues or concerns.  

## 2019-06-18 NOTE — Telephone Encounter (Signed)
Left message on patients voicemail to please return our call.   

## 2019-06-22 ENCOUNTER — Ambulatory Visit
Admission: RE | Admit: 2019-06-22 | Discharge: 2019-06-22 | Disposition: A | Discharge status: Home / Self Care | Payer: Federal, State, Local not specified - PPO | Source: Ambulatory Visit | Attending: Family Medicine | Admitting: Family Medicine

## 2019-06-22 ENCOUNTER — Other Ambulatory Visit: Payer: Self-pay

## 2019-06-22 DIAGNOSIS — N6320 Unspecified lump in the left breast, unspecified quadrant: Secondary | ICD-10-CM

## 2019-06-22 DIAGNOSIS — N6321 Unspecified lump in the left breast, upper outer quadrant: Secondary | ICD-10-CM | POA: Diagnosis not present

## 2019-06-22 DIAGNOSIS — N6012 Diffuse cystic mastopathy of left breast: Secondary | ICD-10-CM | POA: Diagnosis not present

## 2019-07-18 DIAGNOSIS — L814 Other melanin hyperpigmentation: Secondary | ICD-10-CM | POA: Diagnosis not present

## 2019-07-18 DIAGNOSIS — D225 Melanocytic nevi of trunk: Secondary | ICD-10-CM | POA: Diagnosis not present

## 2019-07-18 DIAGNOSIS — D485 Neoplasm of uncertain behavior of skin: Secondary | ICD-10-CM | POA: Diagnosis not present

## 2019-07-18 DIAGNOSIS — L821 Other seborrheic keratosis: Secondary | ICD-10-CM | POA: Diagnosis not present

## 2019-07-18 DIAGNOSIS — Z85828 Personal history of other malignant neoplasm of skin: Secondary | ICD-10-CM | POA: Diagnosis not present

## 2019-08-04 DIAGNOSIS — J019 Acute sinusitis, unspecified: Secondary | ICD-10-CM | POA: Diagnosis not present

## 2019-09-06 DIAGNOSIS — L259 Unspecified contact dermatitis, unspecified cause: Secondary | ICD-10-CM | POA: Diagnosis not present

## 2019-09-18 ENCOUNTER — Ambulatory Visit: Payer: Federal, State, Local not specified - PPO | Admitting: Cardiology

## 2019-10-01 DIAGNOSIS — Z1211 Encounter for screening for malignant neoplasm of colon: Secondary | ICD-10-CM | POA: Diagnosis not present

## 2019-10-01 DIAGNOSIS — M7732 Calcaneal spur, left foot: Secondary | ICD-10-CM | POA: Diagnosis not present

## 2019-10-01 DIAGNOSIS — E6609 Other obesity due to excess calories: Secondary | ICD-10-CM | POA: Diagnosis not present

## 2019-10-01 DIAGNOSIS — M722 Plantar fascial fibromatosis: Secondary | ICD-10-CM | POA: Diagnosis not present

## 2019-10-01 DIAGNOSIS — M79672 Pain in left foot: Secondary | ICD-10-CM | POA: Diagnosis not present

## 2019-10-01 DIAGNOSIS — M19072 Primary osteoarthritis, left ankle and foot: Secondary | ICD-10-CM | POA: Diagnosis not present

## 2019-10-01 DIAGNOSIS — G8929 Other chronic pain: Secondary | ICD-10-CM | POA: Diagnosis not present

## 2019-10-17 ENCOUNTER — Ambulatory Visit: Payer: Federal, State, Local not specified - PPO | Admitting: Cardiology

## 2019-11-13 ENCOUNTER — Ambulatory Visit: Payer: Federal, State, Local not specified - PPO | Admitting: Cardiology

## 2019-12-01 ENCOUNTER — Other Ambulatory Visit: Payer: Self-pay | Admitting: Cardiology

## 2019-12-10 DIAGNOSIS — Z1211 Encounter for screening for malignant neoplasm of colon: Secondary | ICD-10-CM | POA: Diagnosis not present

## 2019-12-10 DIAGNOSIS — D123 Benign neoplasm of transverse colon: Secondary | ICD-10-CM | POA: Diagnosis not present

## 2019-12-10 DIAGNOSIS — D120 Benign neoplasm of cecum: Secondary | ICD-10-CM | POA: Diagnosis not present

## 2019-12-10 DIAGNOSIS — Z86010 Personal history of colonic polyps: Secondary | ICD-10-CM | POA: Diagnosis not present

## 2019-12-10 DIAGNOSIS — K635 Polyp of colon: Secondary | ICD-10-CM | POA: Diagnosis not present

## 2019-12-20 DIAGNOSIS — D122 Benign neoplasm of ascending colon: Secondary | ICD-10-CM | POA: Diagnosis not present

## 2020-01-21 DIAGNOSIS — M9904 Segmental and somatic dysfunction of sacral region: Secondary | ICD-10-CM | POA: Diagnosis not present

## 2020-01-21 DIAGNOSIS — M62830 Muscle spasm of back: Secondary | ICD-10-CM | POA: Diagnosis not present

## 2020-01-21 DIAGNOSIS — M542 Cervicalgia: Secondary | ICD-10-CM | POA: Diagnosis not present

## 2020-01-21 DIAGNOSIS — M5412 Radiculopathy, cervical region: Secondary | ICD-10-CM | POA: Diagnosis not present

## 2020-01-24 DIAGNOSIS — M5412 Radiculopathy, cervical region: Secondary | ICD-10-CM | POA: Diagnosis not present

## 2020-01-24 DIAGNOSIS — M542 Cervicalgia: Secondary | ICD-10-CM | POA: Diagnosis not present

## 2020-01-24 DIAGNOSIS — M9904 Segmental and somatic dysfunction of sacral region: Secondary | ICD-10-CM | POA: Diagnosis not present

## 2020-01-24 DIAGNOSIS — M62830 Muscle spasm of back: Secondary | ICD-10-CM | POA: Diagnosis not present

## 2020-01-28 DIAGNOSIS — M62830 Muscle spasm of back: Secondary | ICD-10-CM | POA: Diagnosis not present

## 2020-01-28 DIAGNOSIS — M542 Cervicalgia: Secondary | ICD-10-CM | POA: Diagnosis not present

## 2020-01-28 DIAGNOSIS — M9904 Segmental and somatic dysfunction of sacral region: Secondary | ICD-10-CM | POA: Diagnosis not present

## 2020-01-28 DIAGNOSIS — M5412 Radiculopathy, cervical region: Secondary | ICD-10-CM | POA: Diagnosis not present

## 2020-02-11 DIAGNOSIS — K635 Polyp of colon: Secondary | ICD-10-CM | POA: Diagnosis not present

## 2020-02-12 DIAGNOSIS — M62830 Muscle spasm of back: Secondary | ICD-10-CM | POA: Diagnosis not present

## 2020-02-12 DIAGNOSIS — Z01818 Encounter for other preprocedural examination: Secondary | ICD-10-CM | POA: Diagnosis not present

## 2020-02-12 DIAGNOSIS — M5412 Radiculopathy, cervical region: Secondary | ICD-10-CM | POA: Diagnosis not present

## 2020-02-12 DIAGNOSIS — Z01810 Encounter for preprocedural cardiovascular examination: Secondary | ICD-10-CM | POA: Diagnosis not present

## 2020-02-12 DIAGNOSIS — M9904 Segmental and somatic dysfunction of sacral region: Secondary | ICD-10-CM | POA: Diagnosis not present

## 2020-02-12 DIAGNOSIS — M542 Cervicalgia: Secondary | ICD-10-CM | POA: Diagnosis not present

## 2020-02-12 DIAGNOSIS — D122 Benign neoplasm of ascending colon: Secondary | ICD-10-CM | POA: Diagnosis not present

## 2020-02-12 DIAGNOSIS — Z01812 Encounter for preprocedural laboratory examination: Secondary | ICD-10-CM | POA: Diagnosis not present

## 2020-02-18 DIAGNOSIS — Z5941 Food insecurity: Secondary | ICD-10-CM | POA: Diagnosis not present

## 2020-02-18 DIAGNOSIS — M1990 Unspecified osteoarthritis, unspecified site: Secondary | ICD-10-CM | POA: Diagnosis not present

## 2020-02-18 DIAGNOSIS — K660 Peritoneal adhesions (postprocedural) (postinfection): Secondary | ICD-10-CM | POA: Diagnosis not present

## 2020-02-18 DIAGNOSIS — Z85828 Personal history of other malignant neoplasm of skin: Secondary | ICD-10-CM | POA: Diagnosis not present

## 2020-02-18 DIAGNOSIS — R339 Retention of urine, unspecified: Secondary | ICD-10-CM | POA: Diagnosis not present

## 2020-02-18 DIAGNOSIS — Z881 Allergy status to other antibiotic agents status: Secondary | ICD-10-CM | POA: Diagnosis not present

## 2020-02-18 DIAGNOSIS — E669 Obesity, unspecified: Secondary | ICD-10-CM | POA: Diagnosis not present

## 2020-02-18 DIAGNOSIS — Z6838 Body mass index (BMI) 38.0-38.9, adult: Secondary | ICD-10-CM | POA: Diagnosis not present

## 2020-02-18 DIAGNOSIS — Z8616 Personal history of COVID-19: Secondary | ICD-10-CM | POA: Diagnosis not present

## 2020-02-18 DIAGNOSIS — Z79899 Other long term (current) drug therapy: Secondary | ICD-10-CM | POA: Diagnosis not present

## 2020-02-18 DIAGNOSIS — D122 Benign neoplasm of ascending colon: Secondary | ICD-10-CM | POA: Diagnosis not present

## 2020-02-18 DIAGNOSIS — D120 Benign neoplasm of cecum: Secondary | ICD-10-CM | POA: Diagnosis not present

## 2020-02-21 DIAGNOSIS — D120 Benign neoplasm of cecum: Secondary | ICD-10-CM | POA: Diagnosis not present

## 2020-02-21 DIAGNOSIS — D126 Benign neoplasm of colon, unspecified: Secondary | ICD-10-CM | POA: Diagnosis not present

## 2020-02-21 DIAGNOSIS — D122 Benign neoplasm of ascending colon: Secondary | ICD-10-CM | POA: Diagnosis not present

## 2020-02-21 DIAGNOSIS — G8918 Other acute postprocedural pain: Secondary | ICD-10-CM | POA: Diagnosis not present

## 2020-02-27 DIAGNOSIS — Z8616 Personal history of COVID-19: Secondary | ICD-10-CM | POA: Diagnosis not present

## 2020-02-27 DIAGNOSIS — N3001 Acute cystitis with hematuria: Secondary | ICD-10-CM | POA: Diagnosis not present

## 2020-02-27 DIAGNOSIS — R300 Dysuria: Secondary | ICD-10-CM | POA: Diagnosis not present

## 2020-02-27 DIAGNOSIS — Z85828 Personal history of other malignant neoplasm of skin: Secondary | ICD-10-CM | POA: Diagnosis not present

## 2020-02-27 DIAGNOSIS — Z86010 Personal history of colonic polyps: Secondary | ICD-10-CM | POA: Diagnosis not present

## 2020-02-27 DIAGNOSIS — Z880 Allergy status to penicillin: Secondary | ICD-10-CM | POA: Diagnosis not present

## 2020-02-27 DIAGNOSIS — R339 Retention of urine, unspecified: Secondary | ICD-10-CM | POA: Diagnosis not present

## 2020-02-27 DIAGNOSIS — Z9049 Acquired absence of other specified parts of digestive tract: Secondary | ICD-10-CM | POA: Diagnosis not present

## 2020-02-27 DIAGNOSIS — Z91048 Other nonmedicinal substance allergy status: Secondary | ICD-10-CM | POA: Diagnosis not present

## 2020-02-27 DIAGNOSIS — Z792 Long term (current) use of antibiotics: Secondary | ICD-10-CM | POA: Diagnosis not present

## 2020-02-27 DIAGNOSIS — Z79899 Other long term (current) drug therapy: Secondary | ICD-10-CM | POA: Diagnosis not present

## 2020-03-04 DIAGNOSIS — N3289 Other specified disorders of bladder: Secondary | ICD-10-CM | POA: Diagnosis not present

## 2020-03-04 DIAGNOSIS — N390 Urinary tract infection, site not specified: Secondary | ICD-10-CM | POA: Diagnosis not present

## 2020-03-04 DIAGNOSIS — Z09 Encounter for follow-up examination after completed treatment for conditions other than malignant neoplasm: Secondary | ICD-10-CM | POA: Diagnosis not present

## 2020-03-04 DIAGNOSIS — B952 Enterococcus as the cause of diseases classified elsewhere: Secondary | ICD-10-CM | POA: Diagnosis not present

## 2020-04-02 DIAGNOSIS — M542 Cervicalgia: Secondary | ICD-10-CM | POA: Diagnosis not present

## 2020-04-02 DIAGNOSIS — M5412 Radiculopathy, cervical region: Secondary | ICD-10-CM | POA: Diagnosis not present

## 2020-04-02 DIAGNOSIS — M9904 Segmental and somatic dysfunction of sacral region: Secondary | ICD-10-CM | POA: Diagnosis not present

## 2020-04-02 DIAGNOSIS — M62830 Muscle spasm of back: Secondary | ICD-10-CM | POA: Diagnosis not present

## 2020-04-04 DIAGNOSIS — N9989 Other postprocedural complications and disorders of genitourinary system: Secondary | ICD-10-CM | POA: Diagnosis not present

## 2020-04-04 DIAGNOSIS — R338 Other retention of urine: Secondary | ICD-10-CM | POA: Diagnosis not present

## 2020-06-01 IMAGING — MG MM BREAST LOCALIZATION CLIP
4 series · 4 of 12 positions shown · non-contrast
Comparison: Previous exam(s).

CLINICAL DATA: Confirmation clip placement after ultrasound-guided
core needle biopsy of an indeterminate mass in the UPPER OUTER
QUADRANT of the LEFT breast at POSTERIOR depth.

EXAM:
2D AND TOMOSYNTHESIS DIAGNOSTIC LEFT MAMMOGRAM POST ULTRASOUND
BIOPSY

[L ML synth-2D]
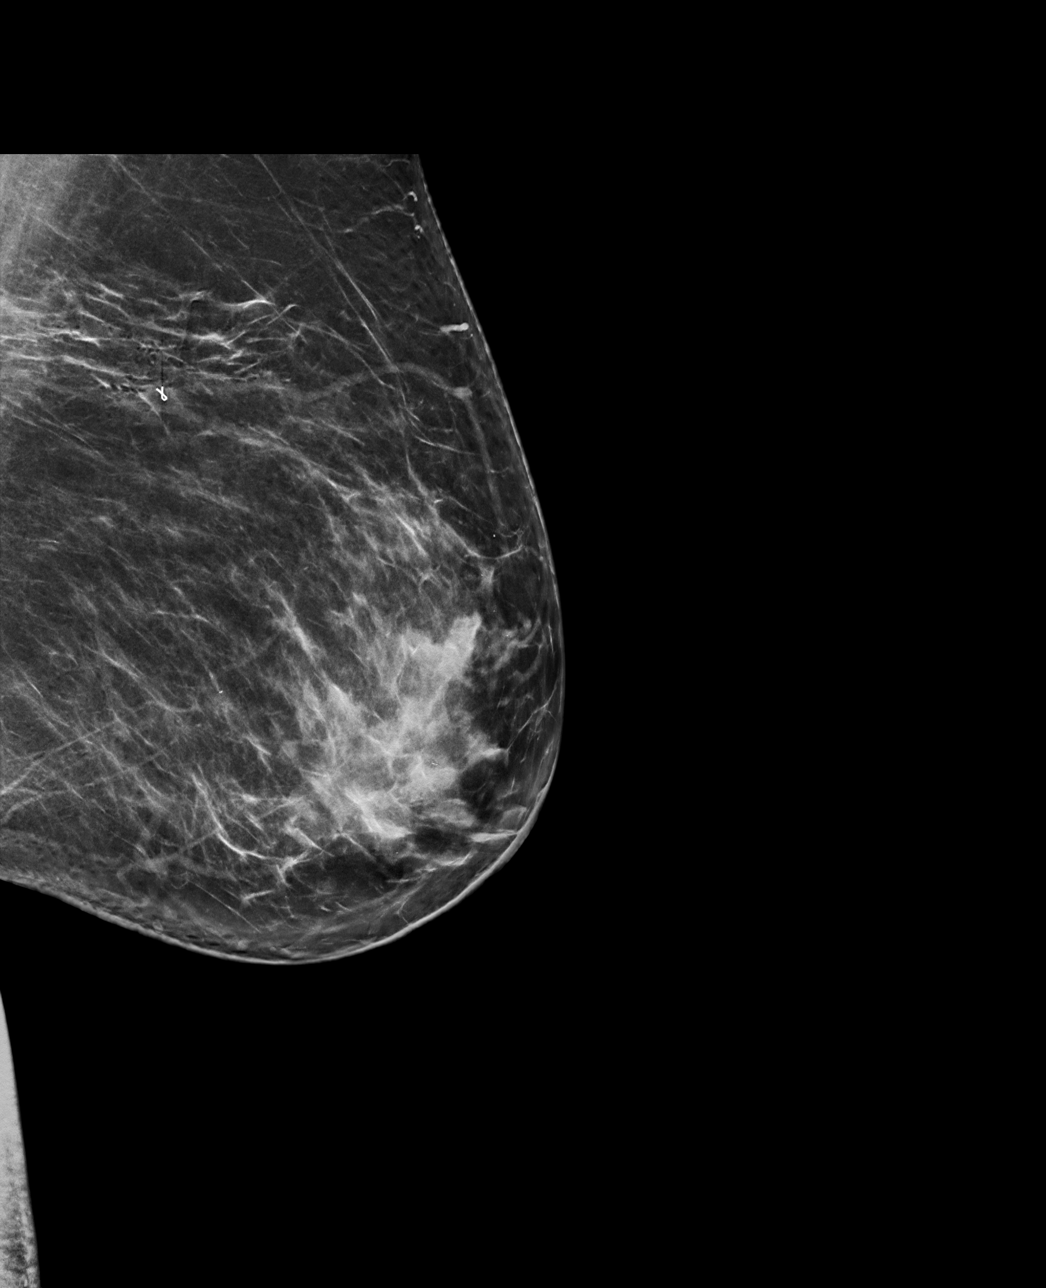

[L CC synth-2D]
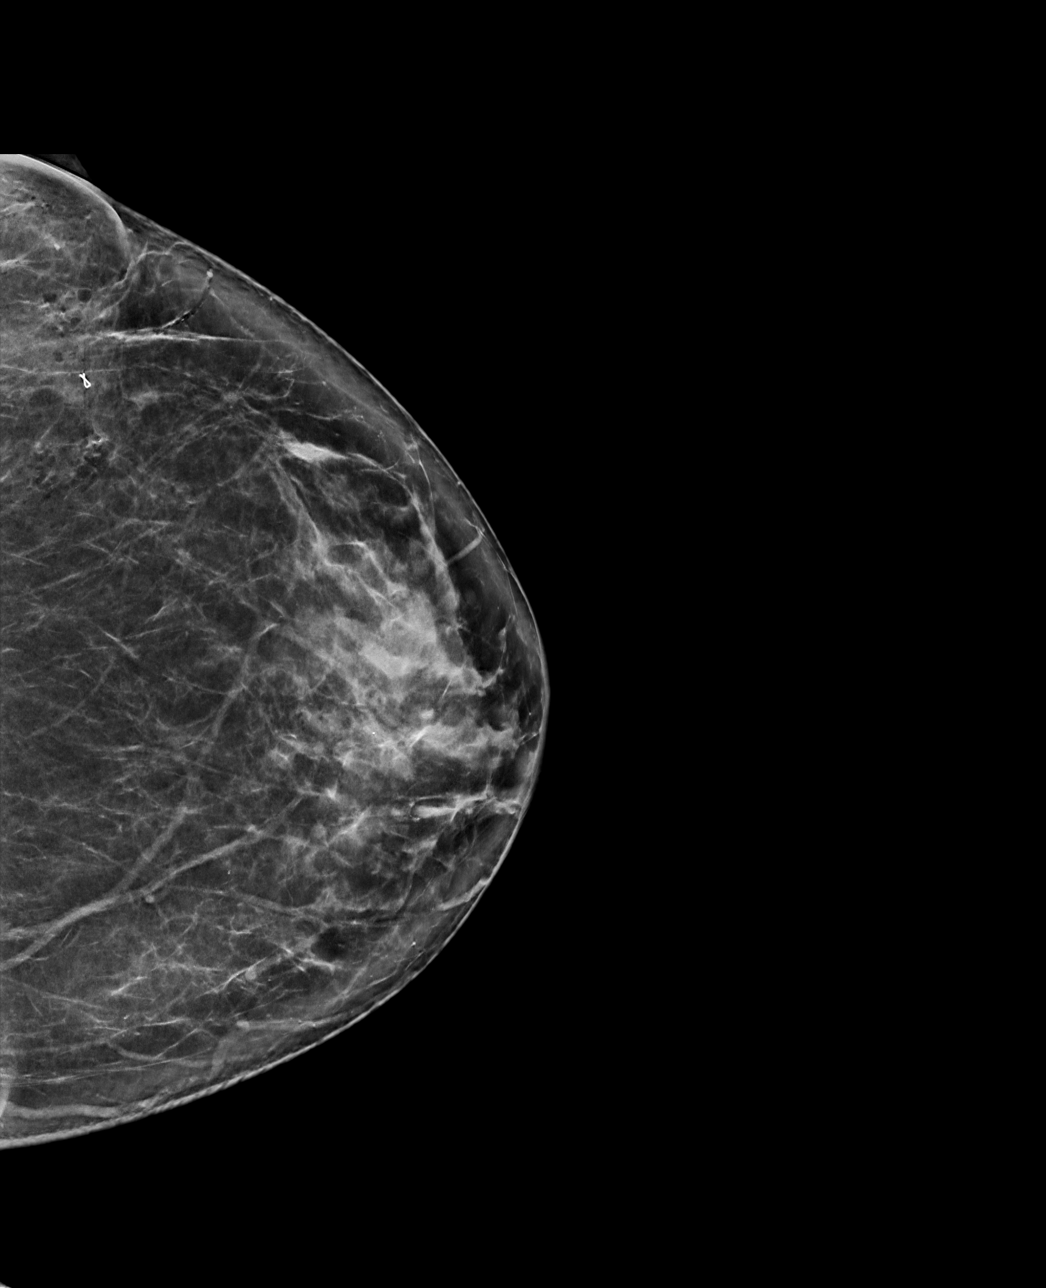

[L CC tomo · tomo slice 43/86.0]
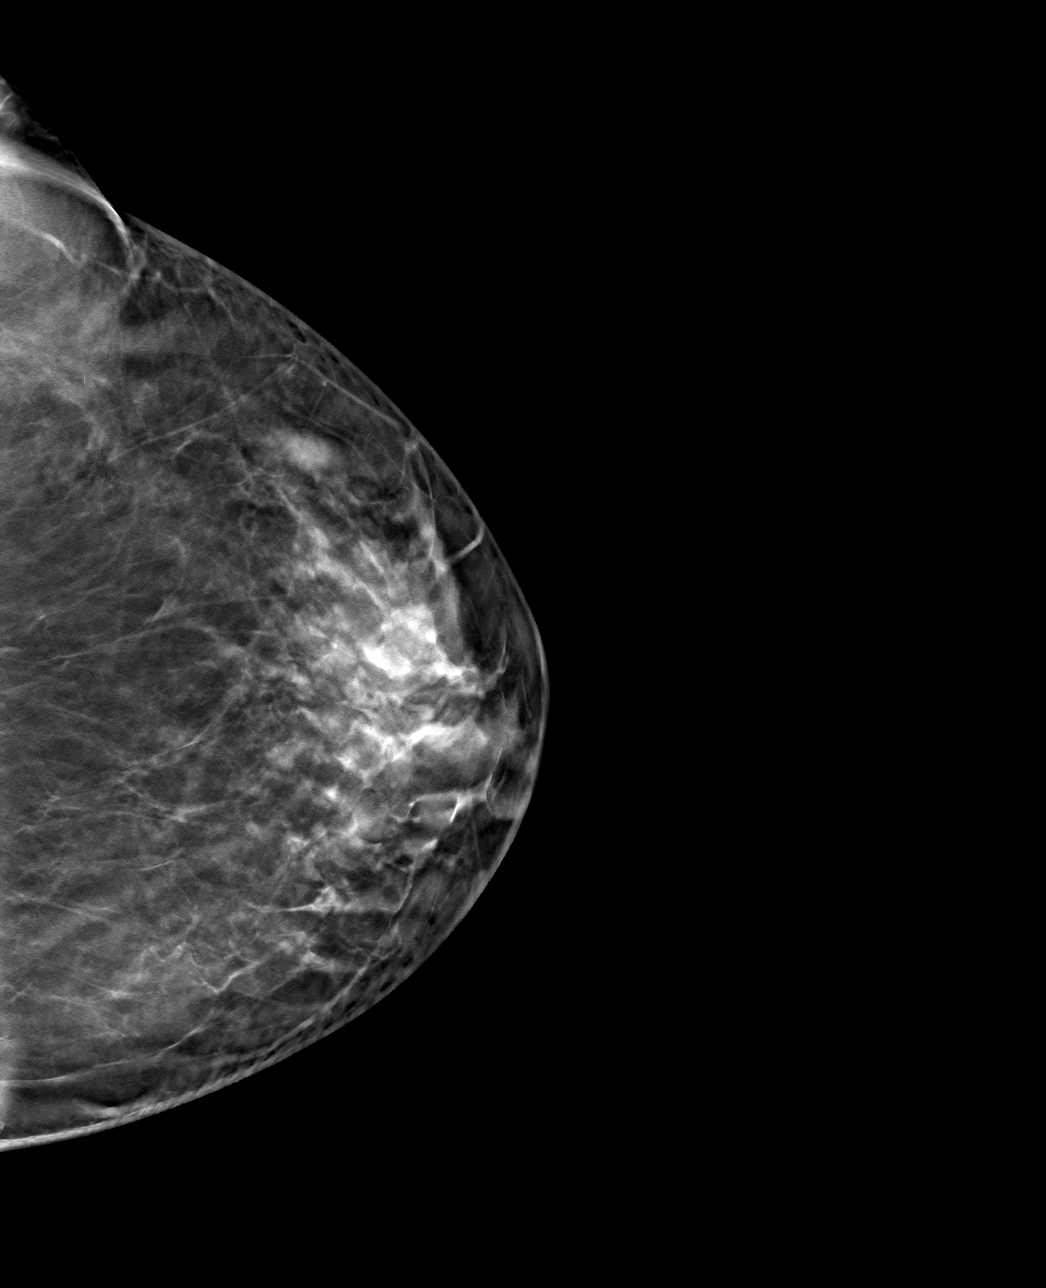

[L ML tomo · tomo slice 45/89.0]
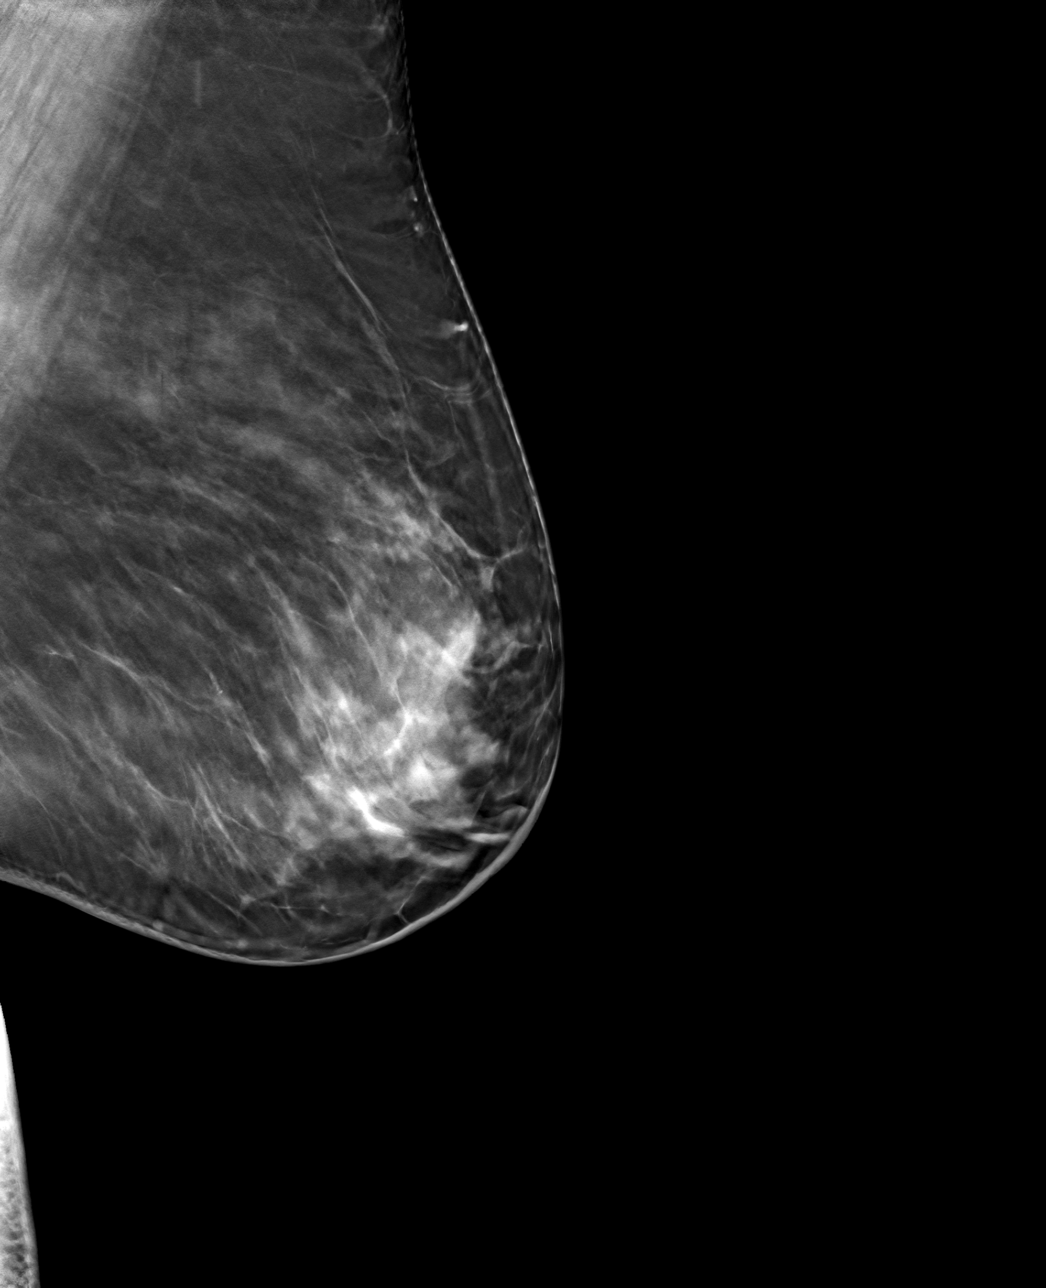

[4 of 12 positions shown; findings below may reference images not displayed]

FINDINGS: Tomosynthesis and synthesized full field CC and mediolateral images
were obtained following ultrasound guided biopsy of a mass involving
the UPPER OUTER QUADRANT of the LEFT breast at POSTERIOR depth. The
ribbon shaped tissue marking clip is appropriately positioned within
the biopsied mass in the UPPER OUTER QUADRANT at POSTERIOR depth.
The mass corresponds to the asymmetry identified on screening
mammography.

Expected post biopsy changes are present without evidence of
hematoma.
IMPRESSION: Appropriate positioning of the ribbon shaped tissue marking clip
within the biopsied mass in the UPPER OUTER QUADRANT the LEFT breast
at POSTERIOR depth.

Final Assessment: Post Procedure Mammograms for Marker Placement

## 2020-06-06 DIAGNOSIS — D122 Benign neoplasm of ascending colon: Secondary | ICD-10-CM | POA: Diagnosis not present

## 2020-07-28 ENCOUNTER — Other Ambulatory Visit: Payer: Self-pay | Admitting: Family Medicine

## 2020-07-28 DIAGNOSIS — Z1231 Encounter for screening mammogram for malignant neoplasm of breast: Secondary | ICD-10-CM

## 2020-07-29 ENCOUNTER — Ambulatory Visit
Admission: RE | Admit: 2020-07-29 | Discharge: 2020-07-29 | Disposition: A | Discharge status: Home / Self Care | Payer: Federal, State, Local not specified - PPO | Source: Ambulatory Visit | Attending: Family Medicine | Admitting: Family Medicine

## 2020-07-29 ENCOUNTER — Other Ambulatory Visit: Payer: Self-pay

## 2020-07-29 DIAGNOSIS — Z1231 Encounter for screening mammogram for malignant neoplasm of breast: Secondary | ICD-10-CM

## 2020-08-13 DIAGNOSIS — M5412 Radiculopathy, cervical region: Secondary | ICD-10-CM | POA: Diagnosis not present

## 2020-08-13 DIAGNOSIS — M542 Cervicalgia: Secondary | ICD-10-CM | POA: Diagnosis not present

## 2020-08-13 DIAGNOSIS — M9904 Segmental and somatic dysfunction of sacral region: Secondary | ICD-10-CM | POA: Diagnosis not present

## 2020-08-13 DIAGNOSIS — M62830 Muscle spasm of back: Secondary | ICD-10-CM | POA: Diagnosis not present

## 2020-08-14 DIAGNOSIS — M9904 Segmental and somatic dysfunction of sacral region: Secondary | ICD-10-CM | POA: Diagnosis not present

## 2020-08-14 DIAGNOSIS — M542 Cervicalgia: Secondary | ICD-10-CM | POA: Diagnosis not present

## 2020-08-14 DIAGNOSIS — M5412 Radiculopathy, cervical region: Secondary | ICD-10-CM | POA: Diagnosis not present

## 2020-08-14 DIAGNOSIS — M62830 Muscle spasm of back: Secondary | ICD-10-CM | POA: Diagnosis not present

## 2020-09-01 DIAGNOSIS — M546 Pain in thoracic spine: Secondary | ICD-10-CM | POA: Diagnosis not present

## 2020-09-01 DIAGNOSIS — M5417 Radiculopathy, lumbosacral region: Secondary | ICD-10-CM | POA: Diagnosis not present

## 2020-09-01 DIAGNOSIS — M9902 Segmental and somatic dysfunction of thoracic region: Secondary | ICD-10-CM | POA: Diagnosis not present

## 2020-09-01 DIAGNOSIS — M9903 Segmental and somatic dysfunction of lumbar region: Secondary | ICD-10-CM | POA: Diagnosis not present

## 2020-10-02 DIAGNOSIS — Z13220 Encounter for screening for lipoid disorders: Secondary | ICD-10-CM | POA: Diagnosis not present

## 2020-10-02 DIAGNOSIS — Z0000 Encounter for general adult medical examination without abnormal findings: Secondary | ICD-10-CM | POA: Diagnosis not present

## 2020-10-02 DIAGNOSIS — Z1329 Encounter for screening for other suspected endocrine disorder: Secondary | ICD-10-CM | POA: Diagnosis not present

## 2020-10-02 DIAGNOSIS — Z131 Encounter for screening for diabetes mellitus: Secondary | ICD-10-CM | POA: Diagnosis not present

## 2020-10-15 DIAGNOSIS — Z23 Encounter for immunization: Secondary | ICD-10-CM | POA: Diagnosis not present

## 2020-10-15 DIAGNOSIS — Z0000 Encounter for general adult medical examination without abnormal findings: Secondary | ICD-10-CM | POA: Diagnosis not present

## 2020-10-15 DIAGNOSIS — Z6835 Body mass index (BMI) 35.0-35.9, adult: Secondary | ICD-10-CM | POA: Diagnosis not present

## 2020-10-15 DIAGNOSIS — N952 Postmenopausal atrophic vaginitis: Secondary | ICD-10-CM | POA: Diagnosis not present

## 2020-10-15 DIAGNOSIS — D122 Benign neoplasm of ascending colon: Secondary | ICD-10-CM | POA: Diagnosis not present

## 2020-10-15 DIAGNOSIS — E7800 Pure hypercholesterolemia, unspecified: Secondary | ICD-10-CM | POA: Diagnosis not present

## 2020-10-22 DIAGNOSIS — Z85828 Personal history of other malignant neoplasm of skin: Secondary | ICD-10-CM | POA: Diagnosis not present

## 2020-10-22 DIAGNOSIS — L820 Inflamed seborrheic keratosis: Secondary | ICD-10-CM | POA: Diagnosis not present

## 2020-10-22 DIAGNOSIS — D1801 Hemangioma of skin and subcutaneous tissue: Secondary | ICD-10-CM | POA: Diagnosis not present

## 2020-10-22 DIAGNOSIS — L814 Other melanin hyperpigmentation: Secondary | ICD-10-CM | POA: Diagnosis not present

## 2020-12-19 DIAGNOSIS — Z86010 Personal history of colonic polyps: Secondary | ICD-10-CM | POA: Diagnosis not present

## 2020-12-19 DIAGNOSIS — Z09 Encounter for follow-up examination after completed treatment for conditions other than malignant neoplasm: Secondary | ICD-10-CM | POA: Diagnosis not present

## 2020-12-19 DIAGNOSIS — D123 Benign neoplasm of transverse colon: Secondary | ICD-10-CM | POA: Diagnosis not present

## 2020-12-19 DIAGNOSIS — D122 Benign neoplasm of ascending colon: Secondary | ICD-10-CM | POA: Diagnosis not present

## 2020-12-19 DIAGNOSIS — Z1211 Encounter for screening for malignant neoplasm of colon: Secondary | ICD-10-CM | POA: Diagnosis not present

## 2021-01-14 DIAGNOSIS — Z23 Encounter for immunization: Secondary | ICD-10-CM | POA: Diagnosis not present

## 2021-01-26 DIAGNOSIS — M9904 Segmental and somatic dysfunction of sacral region: Secondary | ICD-10-CM | POA: Diagnosis not present

## 2021-01-26 DIAGNOSIS — M9903 Segmental and somatic dysfunction of lumbar region: Secondary | ICD-10-CM | POA: Diagnosis not present

## 2021-01-26 DIAGNOSIS — M542 Cervicalgia: Secondary | ICD-10-CM | POA: Diagnosis not present

## 2021-01-26 DIAGNOSIS — M5417 Radiculopathy, lumbosacral region: Secondary | ICD-10-CM | POA: Diagnosis not present

## 2021-09-11 ENCOUNTER — Other Ambulatory Visit: Payer: Self-pay | Admitting: Family Medicine

## 2021-09-11 DIAGNOSIS — Z1231 Encounter for screening mammogram for malignant neoplasm of breast: Secondary | ICD-10-CM

## 2021-10-05 ENCOUNTER — Ambulatory Visit
Admission: RE | Admit: 2021-10-05 | Discharge: 2021-10-05 | Disposition: A | Discharge status: Home / Self Care | Payer: Medicare Other | Source: Ambulatory Visit | Attending: Family Medicine | Admitting: Family Medicine

## 2021-10-05 DIAGNOSIS — Z1231 Encounter for screening mammogram for malignant neoplasm of breast: Secondary | ICD-10-CM

## 2021-10-08 ENCOUNTER — Other Ambulatory Visit: Payer: Self-pay | Admitting: Family Medicine

## 2021-10-08 DIAGNOSIS — R928 Other abnormal and inconclusive findings on diagnostic imaging of breast: Secondary | ICD-10-CM

## 2021-10-13 ENCOUNTER — Ambulatory Visit
Admission: RE | Admit: 2021-10-13 | Discharge: 2021-10-13 | Disposition: A | Discharge status: Home / Self Care | Payer: Medicare Other | Source: Ambulatory Visit | Attending: Family Medicine | Admitting: Family Medicine

## 2021-10-13 DIAGNOSIS — R928 Other abnormal and inconclusive findings on diagnostic imaging of breast: Secondary | ICD-10-CM

## 2021-10-19 ENCOUNTER — Other Ambulatory Visit: Payer: Self-pay | Admitting: Family Medicine

## 2021-10-19 ENCOUNTER — Ambulatory Visit
Admission: RE | Admit: 2021-10-19 | Discharge: 2021-10-19 | Disposition: A | Discharge status: Home / Self Care | Payer: Medicare Other | Source: Ambulatory Visit | Attending: Family Medicine | Admitting: Family Medicine

## 2021-10-19 DIAGNOSIS — R921 Mammographic calcification found on diagnostic imaging of breast: Secondary | ICD-10-CM

## 2022-04-20 ENCOUNTER — Ambulatory Visit
Admission: RE | Admit: 2022-04-20 | Discharge: 2022-04-20 | Disposition: A | Discharge status: Home / Self Care | Payer: Medicare Other | Source: Ambulatory Visit | Attending: Family Medicine | Admitting: Family Medicine

## 2022-04-20 DIAGNOSIS — R921 Mammographic calcification found on diagnostic imaging of breast: Secondary | ICD-10-CM

## 2022-04-23 ENCOUNTER — Other Ambulatory Visit: Payer: Self-pay | Admitting: Family Medicine

## 2022-04-23 DIAGNOSIS — R921 Mammographic calcification found on diagnostic imaging of breast: Secondary | ICD-10-CM
